# Patient Record
Sex: Male | Born: 1955 | Race: White | Hispanic: No | Marital: Married | Smoking: Current some day smoker
Health system: Southern US, Community
[De-identification: ages and names within clinical notes are randomized; demographics above are authoritative.]

## PROBLEM LIST (undated history)

## (undated) DIAGNOSIS — E119 Type 2 diabetes mellitus without complications: Secondary | ICD-10-CM

---

## 2016-05-07 ENCOUNTER — Encounter (HOSPITAL_COMMUNITY): Payer: Self-pay | Admitting: Emergency Medicine

## 2016-05-07 ENCOUNTER — Ambulatory Visit (HOSPITAL_COMMUNITY)
Admission: EM | Admit: 2016-05-07 | Discharge: 2016-05-07 | Disposition: A | Discharge status: Home / Self Care | Payer: Worker's Compensation | Attending: Internal Medicine | Admitting: Internal Medicine

## 2016-05-07 ENCOUNTER — Ambulatory Visit (INDEPENDENT_AMBULATORY_CARE_PROVIDER_SITE_OTHER): Payer: Worker's Compensation

## 2016-05-07 DIAGNOSIS — S8991XA Unspecified injury of right lower leg, initial encounter: Secondary | ICD-10-CM | POA: Diagnosis not present

## 2016-05-07 DIAGNOSIS — M2391 Unspecified internal derangement of right knee: Secondary | ICD-10-CM

## 2016-05-07 DIAGNOSIS — S62609A Fracture of unspecified phalanx of unspecified finger, initial encounter for closed fracture: Secondary | ICD-10-CM | POA: Diagnosis not present

## 2016-05-07 HISTORY — DX: Type 2 diabetes mellitus without complications: E11.9

## 2016-05-07 MED ORDER — IBUPROFEN 800 MG PO TABS
ORAL_TABLET | ORAL | Status: AC
  Filled 2016-05-07: qty 1

## 2016-05-07 MED ORDER — IBUPROFEN 800 MG PO TABS
800.0000 mg | ORAL_TABLET | Freq: Once | ORAL | Status: AC
  Administered 2016-05-07: 800 mg via ORAL

## 2016-05-07 NOTE — Discharge Instructions (Signed)
Keep the finger splinted and maintain the knee splint. Use crutches with little to no weightbearing. Ice on the knee and the finger, keep elevated. Use the knee as little as possible. Follow-up with your orthopedist Monday, call for appointment. Ibuprofen 800 mg every 8 hours as needed for pain.

## 2016-05-07 NOTE — ED Triage Notes (Signed)
Pt reports he twisted his right knee yest while at work and also jammed his right 2nd finger when he fell to the ground  Sx include swelling and pain at knee when walking and also has swelling at finger   A&O x4... NAD

## 2016-05-07 NOTE — ED Provider Notes (Signed)
CSN: [id number]     Arrival date & time 05/07/16  1715 History   First MD Initiated Contact with Patient 05/07/16 1907     Chief Complaint  Patient presents with  . Knee Injury  . Finger Injury   (Consider location/radiation/quality/duration/timing/severity/associated sxs/prior Treatment) 60 year old male had his right foot planted in the mud, made a sudden left rotational movement in which the right knee did not rotate with the body. Subsequently fell to the ground and injured the right index finger. This occurred yesterday afternoon. Today he is complaining of persistent right knee pain with decreased range of motion and pain with weightbearing and ambulation. His also complaining of pain, swelling and tenderness to the distal phalanx of the right index finger. Denies injury to the head, neck, back, chest or abdomen.      Past Medical History:  Diagnosis Date  . Diabetes mellitus without complication (HCC)    History reviewed. No pertinent surgical history. History reviewed. No pertinent family history. Social History  Substance Use Topics  . Smoking status: Current Some Day Smoker  . Smokeless tobacco: Never Used  . Alcohol use No    Review of Systems  Constitutional: Negative.   Respiratory: Negative.   Gastrointestinal: Negative.   Genitourinary: Negative.   Musculoskeletal: Positive for arthralgias and joint swelling. Negative for neck pain and neck stiffness.       As per HPI  Skin: Negative.   Neurological: Negative for dizziness, weakness, numbness and headaches.  All other systems reviewed and are negative.   Allergies  Review of patient's allergies indicates no known allergies.  Home Medications   Prior to Admission medications   Medication Sig Start Date End Date Taking? Authorizing Provider  fenofibrate (TRICOR) 145 MG tablet Take 145 mg by mouth daily.   Yes Historical Provider, MD  metFORMIN (GLUCOPHAGE) 500 MG tablet Take by mouth 2 (two) times daily  with a meal.   Yes Historical Provider, MD  pantoprazole (PROTONIX) 40 MG tablet Take 40 mg by mouth daily.   Yes Historical Provider, MD   Meds Ordered and Administered this Visit   Medications  ibuprofen (ADVIL,MOTRIN) tablet 800 mg (800 mg Oral Given 05/07/16 2030)    BP 167/79 (BP Location: Right Arm)   Pulse 68   Temp 97.6 F (36.4 C) (Oral)   Resp 18   SpO2 99%  No data found.   Physical Exam  Constitutional: He is oriented to person, place, and time. He appears well-developed and well-nourished.  HENT:  Head: Normocephalic and atraumatic.  Eyes: EOM are normal. Left eye exhibits no discharge.  Neck: Normal range of motion. Neck supple.  Cardiovascular: Normal rate.   Pulmonary/Chest: Effort normal.  Musculoskeletal: He exhibits edema and tenderness. He exhibits no deformity.  Right index finger with mild swelling and slight redness to the distal phalanx. Positive for tenderness. Flexion to the PIP is normal. Unable to flex the DIP. Distal neurovascular and motor sensory is intact with the exception of range of motion. No apparent deformity.  Right knee with tenderness to the medial and lateral aspect. There is tenderness along the medial and lateral joint line as well as  the lateral joint space.  flexion to 80. Extension to 170. Attempts to perform drawer testing as well as varus and valgus movements produces too much pain to complete. No overt deformity.   Neurological: He is alert and oriented to person, place, and time. No cranial nerve deficit.  Skin: Skin is warm and dry. Capillary  refill takes less than 2 seconds.  Psychiatric: He has a normal mood and affect. His behavior is normal.  Nursing note and vitals reviewed.   Urgent Care Course   Clinical Course    Procedures (including critical care time)  Labs Review Labs Reviewed - No data to display  Imaging Review Dg Knee Complete 4 Views Right  Result Date: 05/07/2016 CLINICAL DATA:  60 year old male with  fall and right knee pain. EXAM: RIGHT KNEE - COMPLETE 4+ VIEW COMPARISON:  None. FINDINGS: There is no acute fracture or dislocation. The bones are osteopenic. No significant arthritic changes. A small bone spur noted arising from the medial epicondyle of the femur. There is no joint effusion. The soft tissues appear unremarkable. IMPRESSION: No acute fracture or dislocation Electronically Signed   By: Elgie Collard M.D.   On: 05/07/2016 19:49   Dg Finger Index Right  Result Date: 05/07/2016 CLINICAL DATA:  Pain after fall EXAM: RIGHT INDEX FINGER 2+V COMPARISON:  None FINDINGS: Linear high attenuation foreign bodies are seen in the volar soft tissues of the distal index finger. The chronicity of this finding is unclear. Deformity of the third metacarpal is consistent with remote trauma. The configuration of the distal phalanx is unusual, particularly on the lateral view. If the patient has point tenderness in this region, a fracture is not excluded. IMPRESSION: Foreign bodies in the soft tissue of the distal index finger. Unusual configuration to the proximal aspect of the distal second phalanx. If the patient has point tenderness in this region, a fracture is not excluded. Electronically Signed   By: Gerome Sam III M.D   On: 05/07/2016 19:50     Visual Acuity Review  Right Eye Distance:   Left Eye Distance:   Bilateral Distance:    Right Eye Near:   Left Eye Near:    Bilateral Near:         MDM   1. Knee injury, right, initial encounter   2. Finger fracture, closed, initial encounter   3. Internal derangement of knee, right    Keep the finger splinted and maintain the knee splint. Use crutches with little to no weightbearing. Ice on the knee and the finger, keep elevated. Use the knee as little as possible. Follow-up with your orthopedist Monday, call for appointment. Ibuprofen 800 mg every 8 hours as needed for pain.     Hayden Rasmussen, NP 05/07/16 2104

## 2016-05-07 NOTE — ED Notes (Signed)
Pt declined crutches... States he has 2 sets at home.... Notified Teena Iraniavid M, NP

## 2018-02-26 IMAGING — DX DG KNEE COMPLETE 4+V*R*
4 series · 4 of 4 positions shown · non-contrast
Comparison: None.

CLINICAL DATA: 60-year-old male with fall and right knee pain.

EXAM:
RIGHT KNEE - COMPLETE 4+ VIEW

[knee ap]
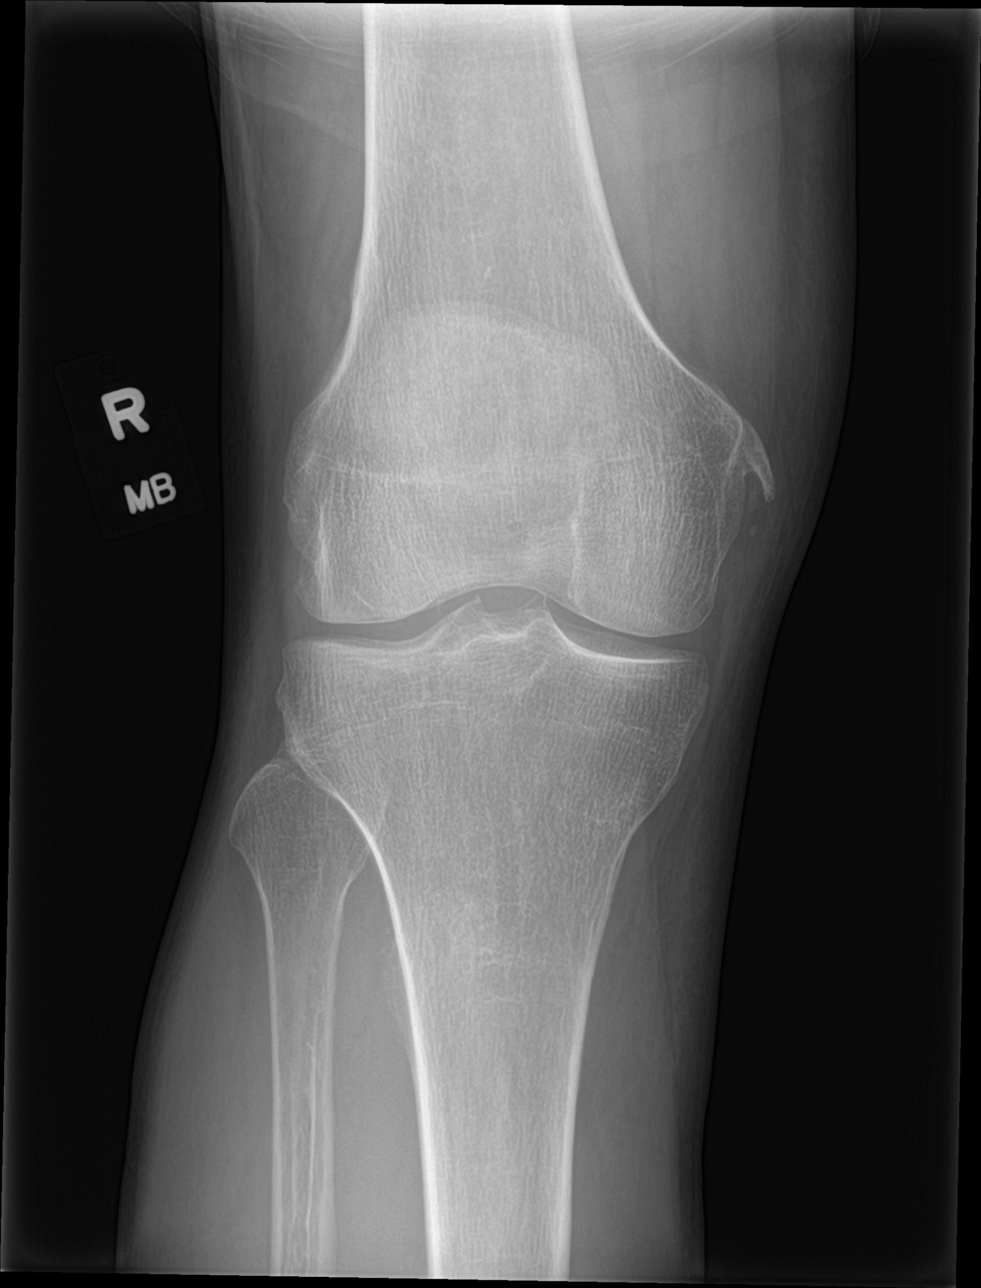

[knee obl (1 of 2)]
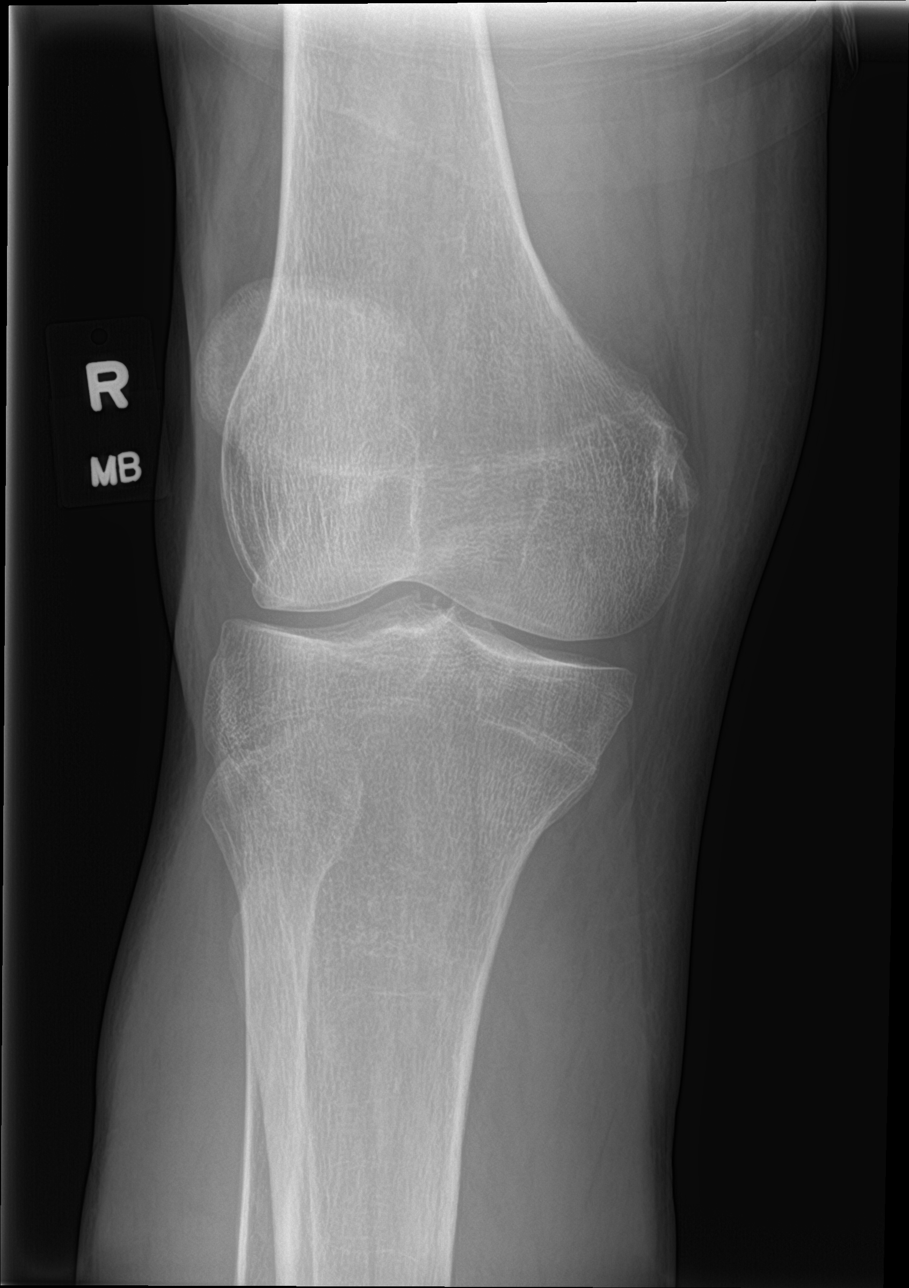

[knee obl (2 of 2)]
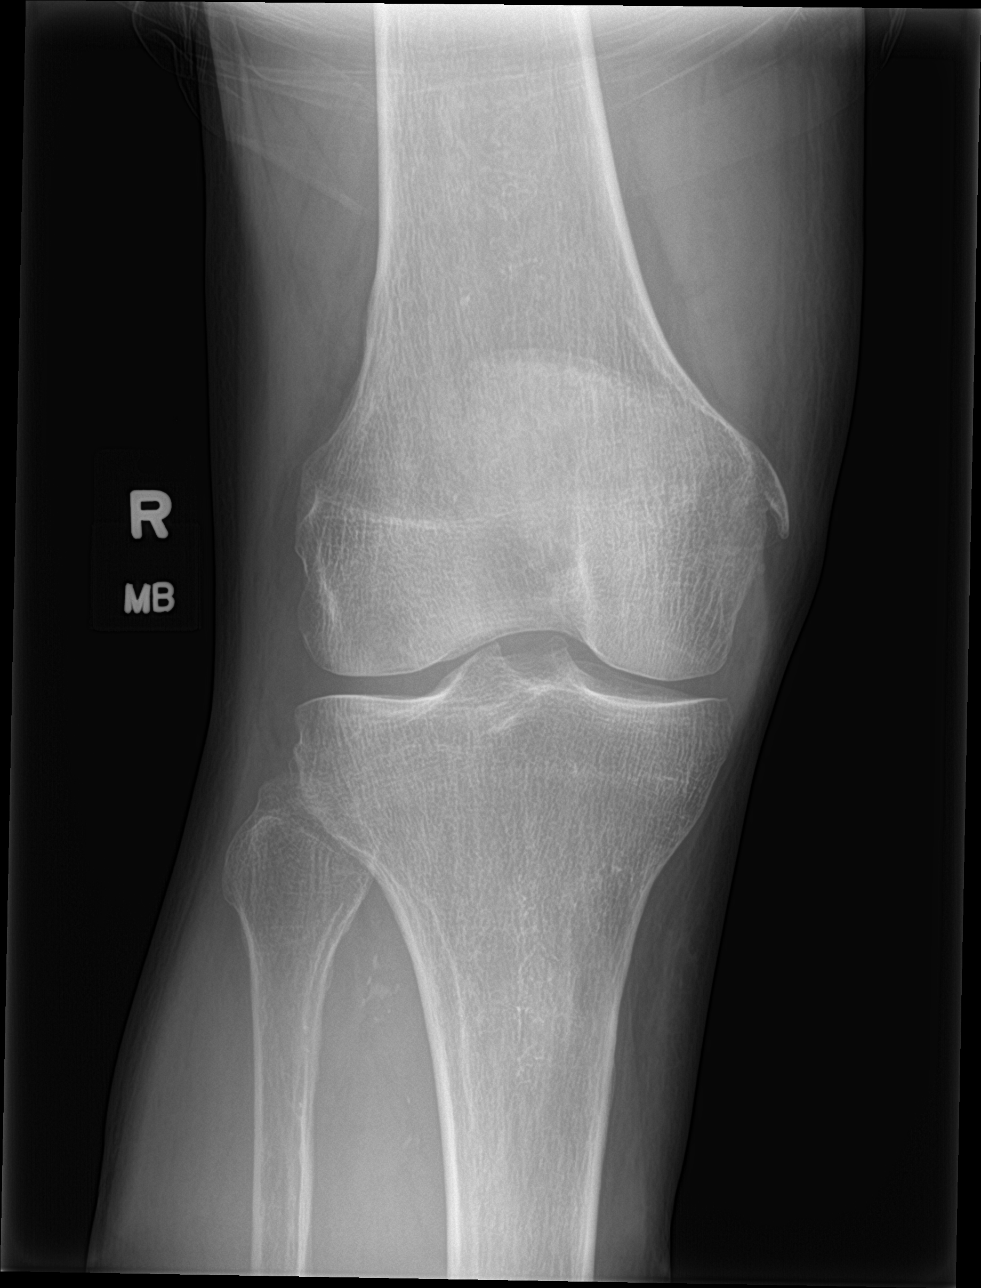

[knee lat]
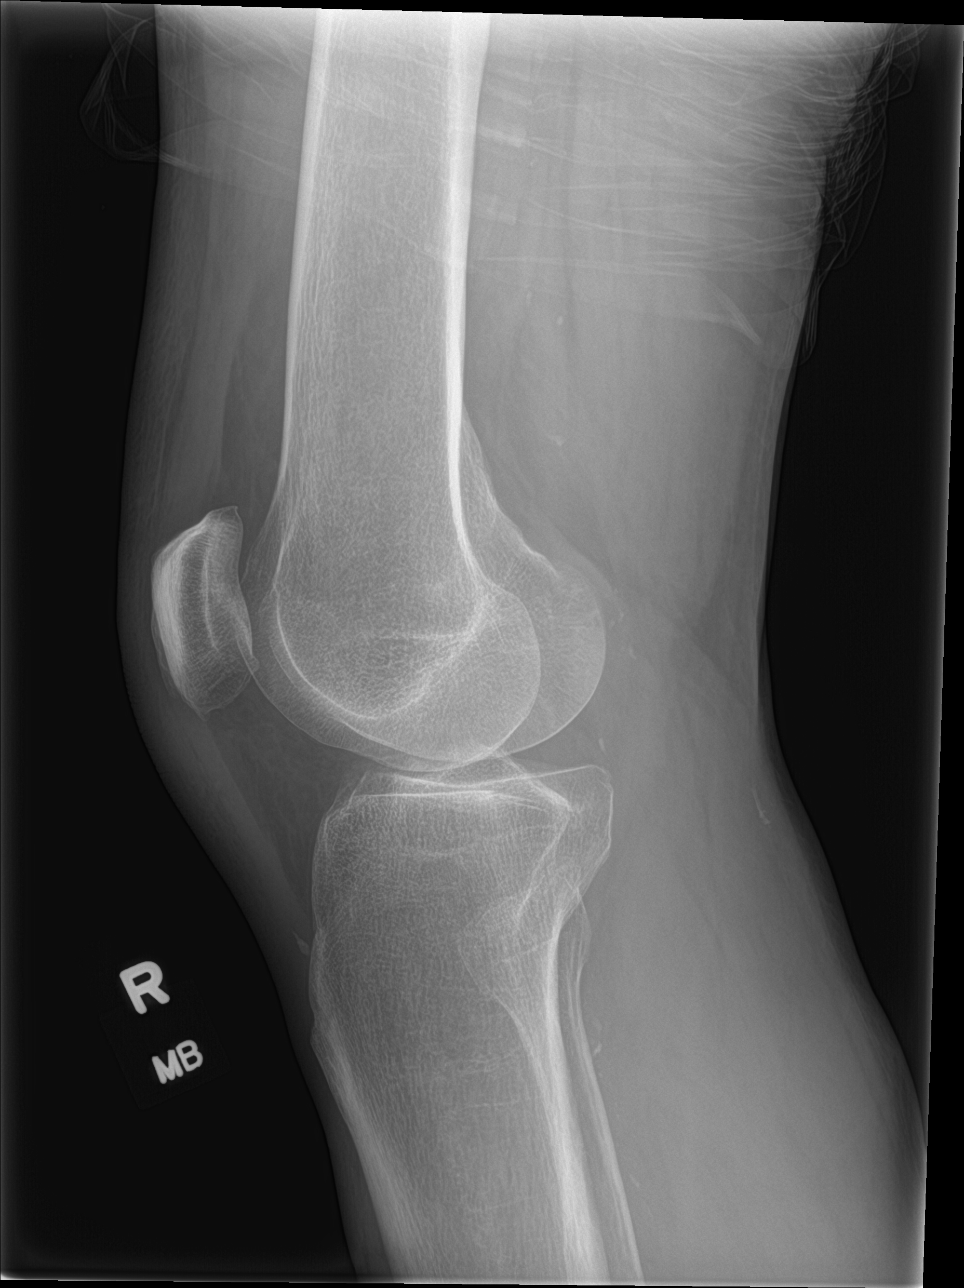

[4 of 4 positions shown; findings below may reference images not displayed]

FINDINGS: There is no acute fracture or dislocation. The bones are osteopenic.
No significant arthritic changes. A small bone spur noted arising
from the medial epicondyle of the femur. There is no joint effusion.
The soft tissues appear unremarkable.
IMPRESSION: No acute fracture or dislocation

## 2018-02-26 IMAGING — DX DG FINGER INDEX 2+V*R*
3 series · 3 of 3 positions shown · non-contrast
Comparison: None

CLINICAL DATA: Pain after fall

EXAM:
RIGHT INDEX FINGER 2+V

[finger ap]
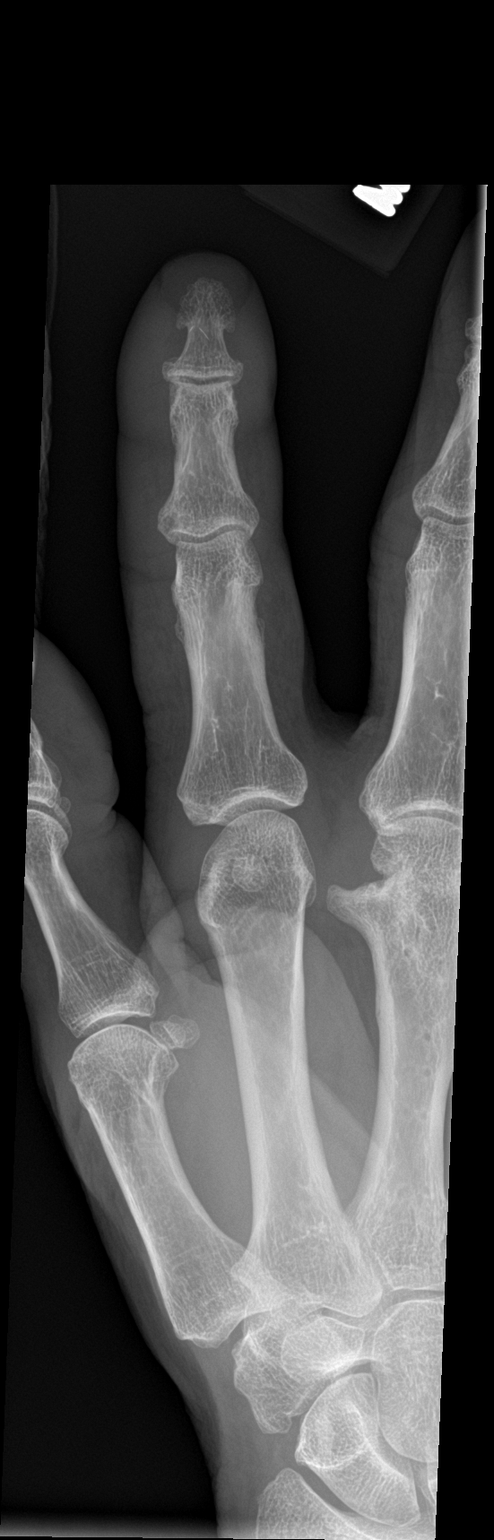

[finger obl]
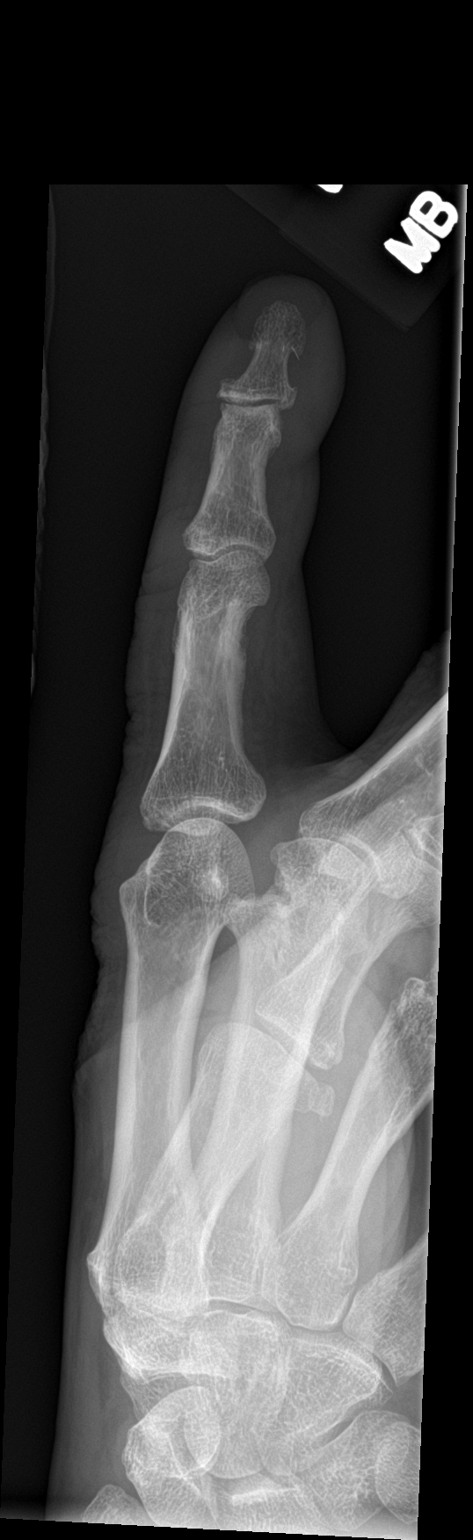

[finger lat]
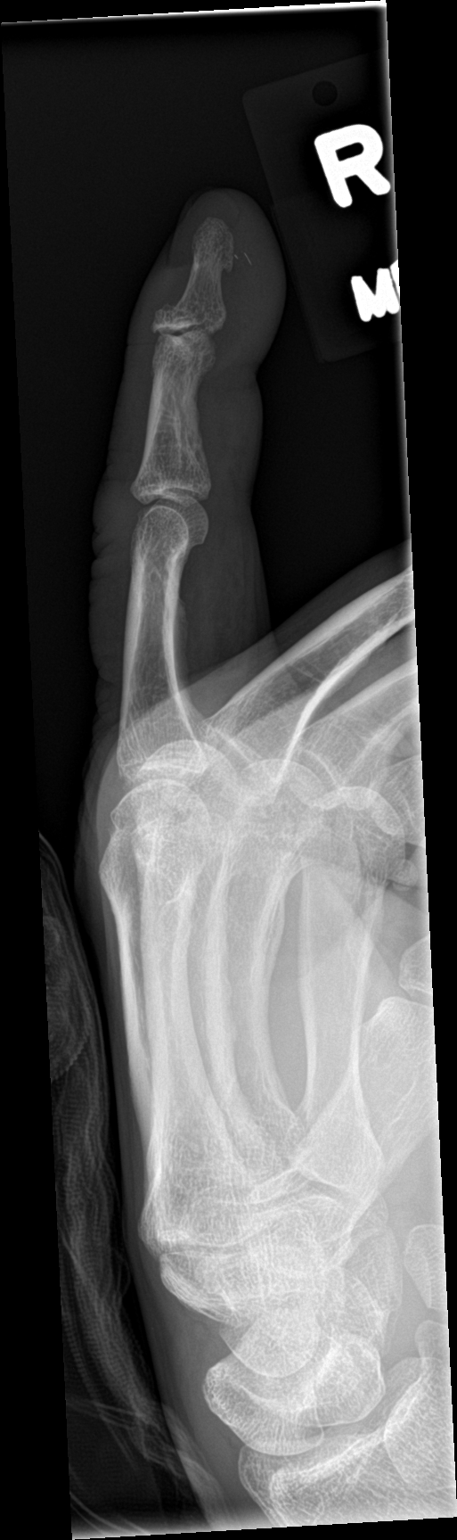

[3 of 3 positions shown; findings below may reference images not displayed]

FINDINGS: Linear high attenuation foreign bodies are seen in the volar soft
tissues of the distal index finger. The chronicity of this finding
is unclear. Deformity of the third metacarpal is consistent with
remote trauma. The configuration of the distal phalanx is unusual,
particularly on the lateral view. If the patient has point
tenderness in this region, a fracture is not excluded.
IMPRESSION: Foreign bodies in the soft tissue of the distal index finger.
Unusual configuration to the proximal aspect of the distal second
phalanx. If the patient has point tenderness in this region, a
fracture is not excluded.
